# Patient Record
Sex: Male | Born: 1965 | Race: Black or African American | Hispanic: No | Marital: Married | Smoking: Never smoker
Health system: Southern US, Community
[De-identification: ages and names within clinical notes are randomized; demographics above are authoritative.]

## PROBLEM LIST (undated history)

## (undated) HISTORY — PX: TONSILLECTOMY: SUR1361

## (undated) HISTORY — PX: WISDOM TOOTH EXTRACTION: SHX21

## (undated) HISTORY — PX: APPENDECTOMY: SHX54

---

## 2014-09-01 ENCOUNTER — Other Ambulatory Visit (HOSPITAL_COMMUNITY): Payer: Self-pay | Admitting: Family Medicine

## 2014-09-01 DIAGNOSIS — R911 Solitary pulmonary nodule: Secondary | ICD-10-CM

## 2014-09-05 ENCOUNTER — Encounter (HOSPITAL_COMMUNITY): Payer: Self-pay

## 2014-09-05 ENCOUNTER — Ambulatory Visit (HOSPITAL_COMMUNITY)
Admission: RE | Admit: 2014-09-05 | Discharge: 2014-09-05 | Disposition: A | Discharge status: Home / Self Care | Payer: 59 | Source: Ambulatory Visit | Attending: Family Medicine | Admitting: Family Medicine

## 2014-09-05 DIAGNOSIS — R911 Solitary pulmonary nodule: Secondary | ICD-10-CM

## 2014-09-05 DIAGNOSIS — R918 Other nonspecific abnormal finding of lung field: Secondary | ICD-10-CM | POA: Insufficient documentation

## 2016-07-16 IMAGING — CT CT CHEST W/O CM
2 of 3 series · 15 of 36 positions shown, 18 images · non-contrast
Comparison: 01/29/2012

CLINICAL DATA: Known pulmonary nodule, followup examination.

EXAM:
CT CHEST WITHOUT CONTRAST
TECHNIQUE: Multidetector CT imaging of the chest was performed following the
standard protocol without IV contrast..

[Series 2: chestroutine 5.0 b40f · axial · 0.66mm/px · z∈[+786,+1061]mm · 12 of 65 slices shown, 15 images]
[im 5/65  mediastinal]
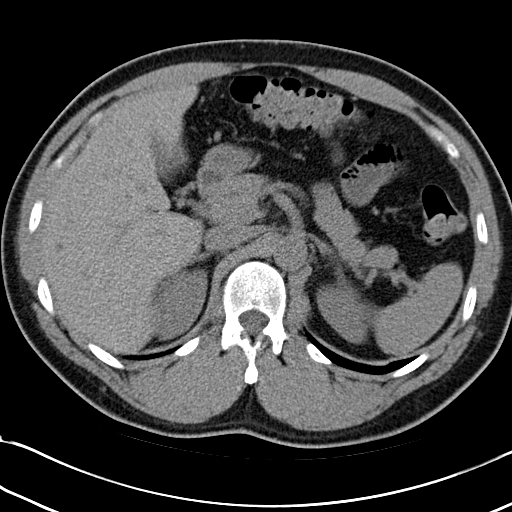
[im 5/65  lung]
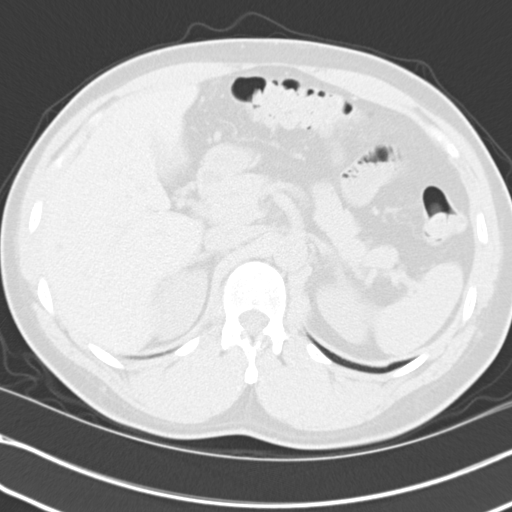
[im 10/65  lung]
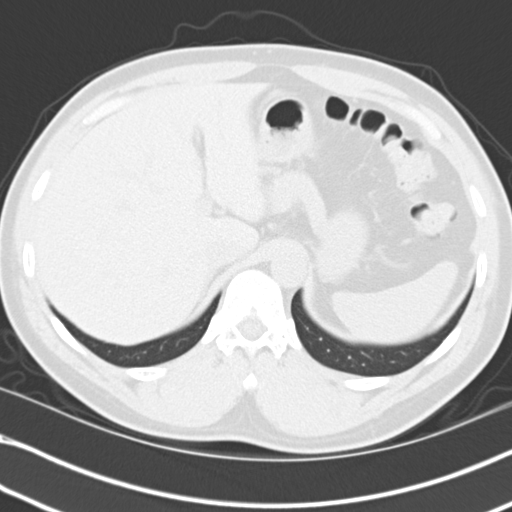
[im 15/65  lung]
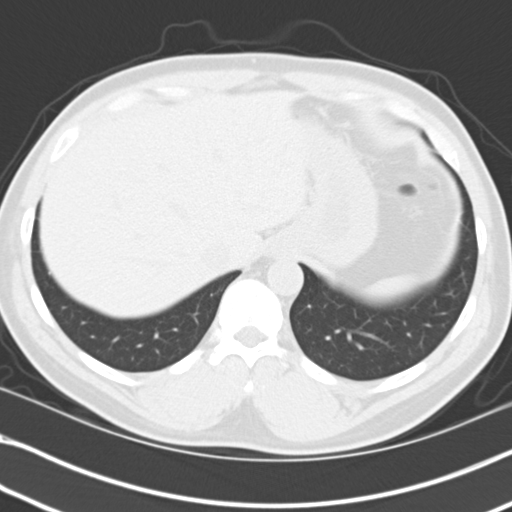
[im 19/65  lung]
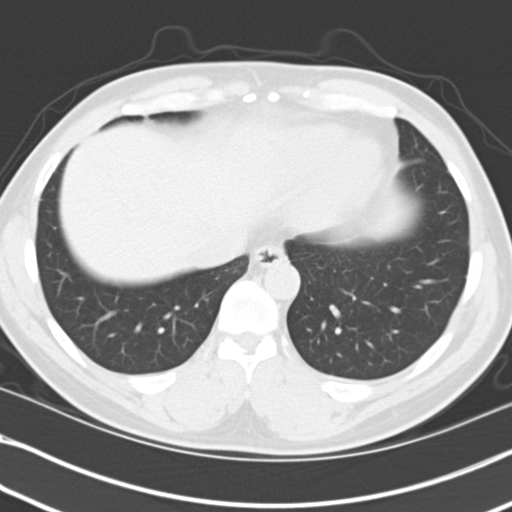
[im 24/65  mediastinal]
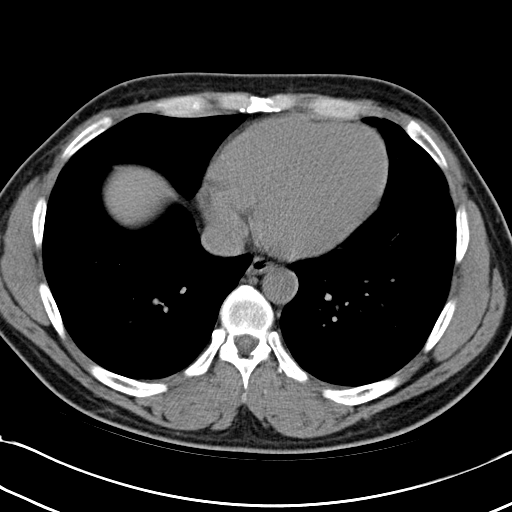
[im 24/65  lung]
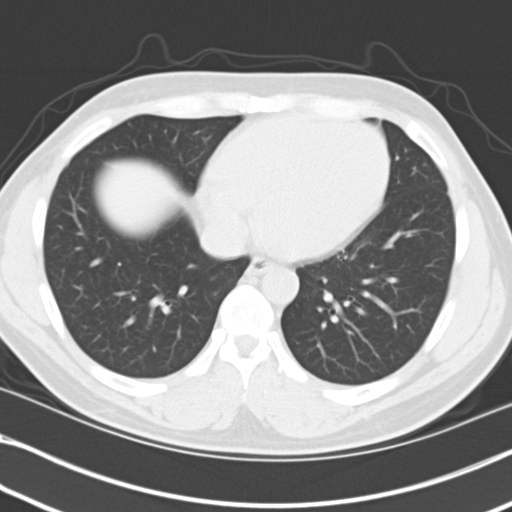
[im 29/65  lung]
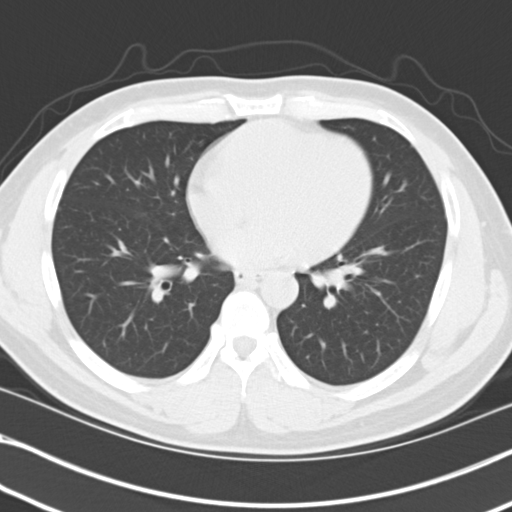
[im 36/65  lung]
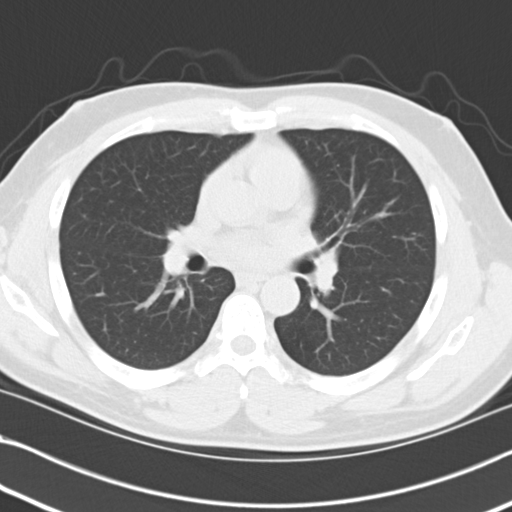
[im 41/65  lung]
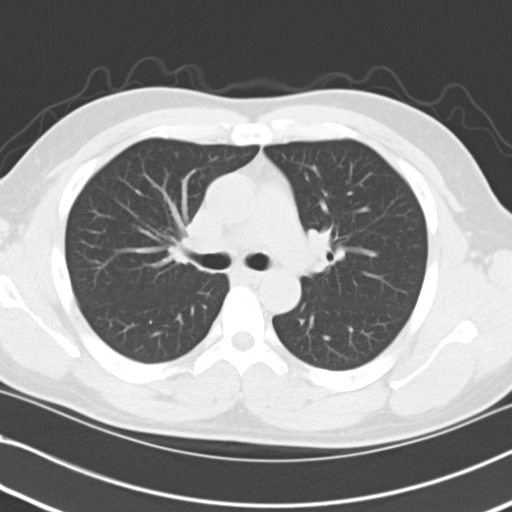
[im 46/65  mediastinal]
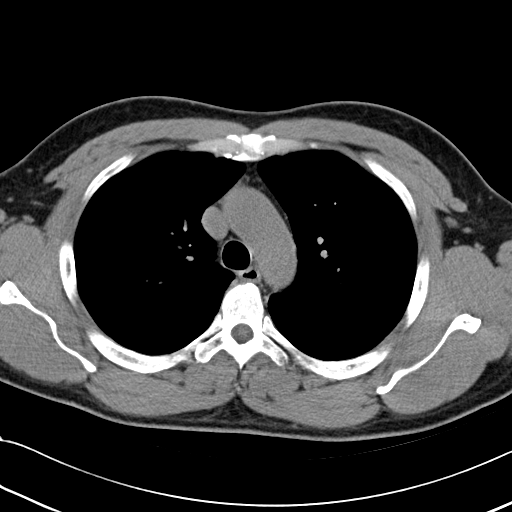
[im 46/65  lung]
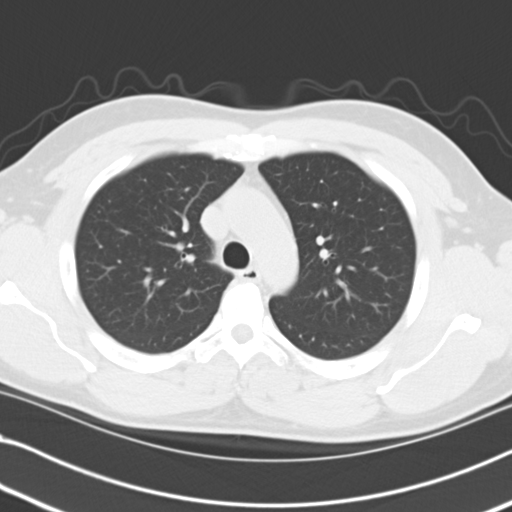
[im 50/65  lung]
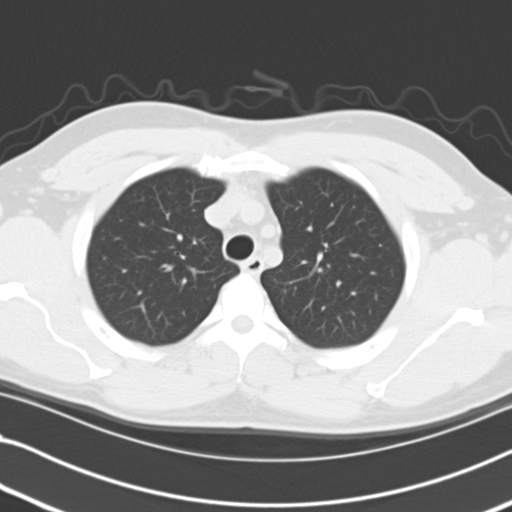
[im 55/65  lung]
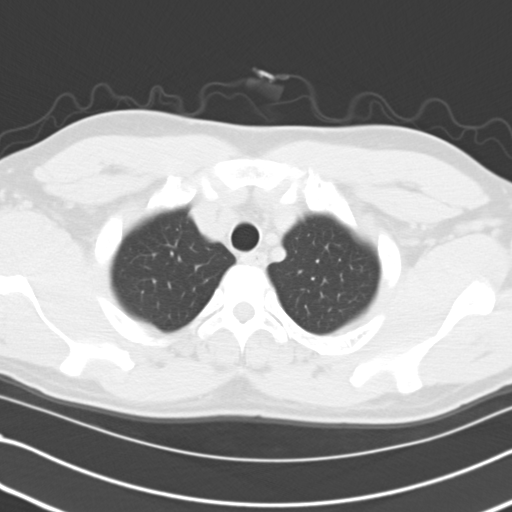
[im 60/65  lung]
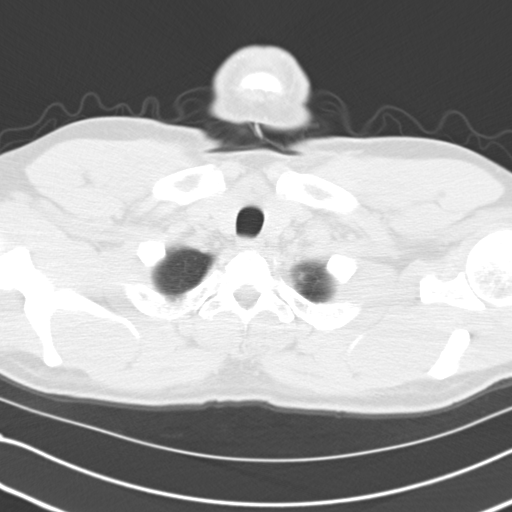

[Series 5: mpr coro 3mm · coronal · 0.64mm/px · 3 of 79 slices shown]
[im 16/79  lung]
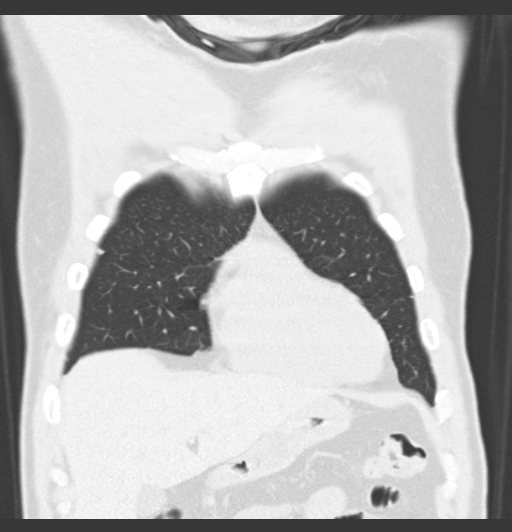
[im 32/79  lung]
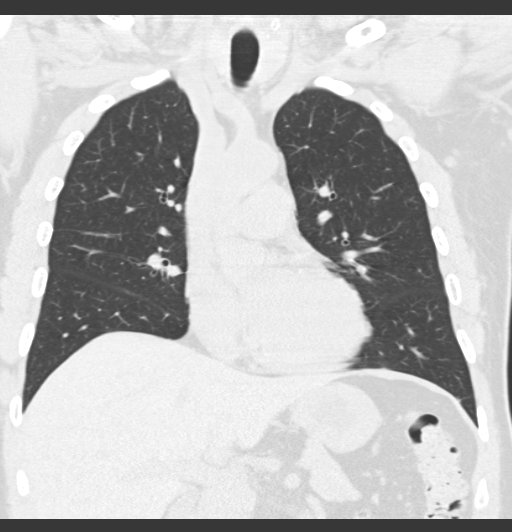
[im 47/79  lung]
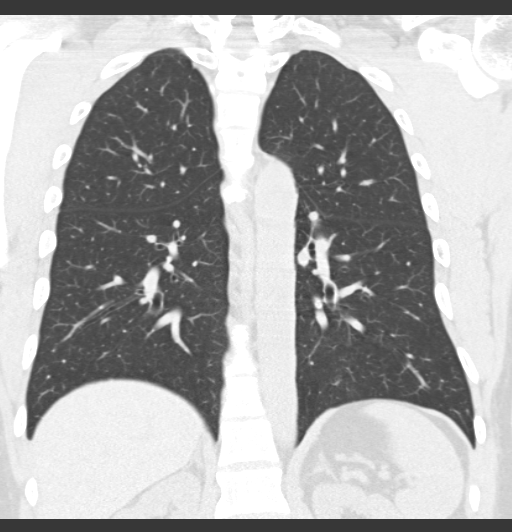

[15 of 36 positions shown; findings below may reference images not displayed]

FINDINGS: The lungs are well aerated bilaterally. No focal infiltrate or
sizable effusion is noted. Previously seen subpleural nodule on the
right in the lower lobe is again identified best seen on image
number 36 of series 3 and image number 31 of series 5. It is stable
in appearance. Given its stability over 2.5 years, no further
followup is necessary. There are however a few other small nodular
densities identified which were not appreciated on the prior exam
due to the imaging technique. These all measure well under 4 mm. No
sizable parenchymal nodule is noted. The thoracic inlet is within
normal limits. No hilar or mediastinal adenopathy is seen. The upper
abdomen shows scattered small cysts throughout the liver. These are
stable in appearance from the prior exam. No other focal upper
abdominal abnormality is seen. The osseous structures are within
normal limits.
IMPRESSION: Stable right lower lobe nodule when compared with the prior exam of
01/29/2012. Given its stability over an extended period of time no
further follow-up is necessary.

Multiple scattered small less than 4 mm nodules predominately within
the left lung. Given the lack of comparison study followup is
recommended as described below. If the patient is at high risk for
bronchogenic carcinoma, follow-up chest CT at 1 year is recommended.
If the patient is at low risk, no follow-up is needed. This
recommendation follows the consensus statement: Guidelines for
Management of Small Pulmonary Nodules Detected on CT Scans: A
Statement from the [HOSPITAL] as published in Radiology
0550; [DATE].

Stable hepatic cysts.

## 2016-11-08 ENCOUNTER — Encounter (HOSPITAL_COMMUNITY): Payer: Self-pay | Admitting: *Deleted

## 2016-11-08 ENCOUNTER — Emergency Department (HOSPITAL_COMMUNITY)
Admission: EM | Admit: 2016-11-08 | Discharge: 2016-11-08 | Disposition: A | Discharge status: Home / Self Care | Payer: 59 | Attending: Emergency Medicine | Admitting: Emergency Medicine

## 2016-11-08 DIAGNOSIS — M79661 Pain in right lower leg: Secondary | ICD-10-CM | POA: Diagnosis not present

## 2016-11-08 DIAGNOSIS — M25521 Pain in right elbow: Secondary | ICD-10-CM | POA: Insufficient documentation

## 2016-11-08 DIAGNOSIS — Y939 Activity, unspecified: Secondary | ICD-10-CM | POA: Insufficient documentation

## 2016-11-08 DIAGNOSIS — Y92410 Unspecified street and highway as the place of occurrence of the external cause: Secondary | ICD-10-CM | POA: Diagnosis not present

## 2016-11-08 DIAGNOSIS — R079 Chest pain, unspecified: Secondary | ICD-10-CM | POA: Diagnosis not present

## 2016-11-08 DIAGNOSIS — Y999 Unspecified external cause status: Secondary | ICD-10-CM | POA: Insufficient documentation

## 2016-11-08 DIAGNOSIS — S8991XA Unspecified injury of right lower leg, initial encounter: Secondary | ICD-10-CM | POA: Diagnosis present

## 2016-11-08 MED ORDER — CYCLOBENZAPRINE HCL 10 MG PO TABS: 10.0000 mg | ORAL_TABLET | Freq: Two times a day (BID) | ORAL | 0 refills | Status: DC | PRN

## 2016-11-08 MED ORDER — IBUPROFEN 600 MG PO TABS: 600.0000 mg | ORAL_TABLET | Freq: Four times a day (QID) | ORAL | 0 refills | Status: DC | PRN

## 2016-11-08 NOTE — ED Notes (Signed)
Acuity increased d/t BP, pt requesting BP to be rechecked, will reassess
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
## 2017-08-06 DIAGNOSIS — H52221 Regular astigmatism, right eye: Secondary | ICD-10-CM | POA: Diagnosis not present

## 2017-10-22 DIAGNOSIS — R7309 Other abnormal glucose: Secondary | ICD-10-CM | POA: Diagnosis not present

## 2017-10-22 DIAGNOSIS — Z1389 Encounter for screening for other disorder: Secondary | ICD-10-CM | POA: Diagnosis not present

## 2017-10-22 DIAGNOSIS — Z0001 Encounter for general adult medical examination with abnormal findings: Secondary | ICD-10-CM | POA: Diagnosis not present

## 2017-10-22 DIAGNOSIS — Z0000 Encounter for general adult medical examination without abnormal findings: Secondary | ICD-10-CM | POA: Diagnosis not present

## 2017-10-22 DIAGNOSIS — R109 Unspecified abdominal pain: Secondary | ICD-10-CM | POA: Diagnosis not present

## 2017-10-22 DIAGNOSIS — R102 Pelvic and perineal pain: Secondary | ICD-10-CM | POA: Diagnosis not present

## 2017-10-29 ENCOUNTER — Other Ambulatory Visit (HOSPITAL_COMMUNITY): Payer: Self-pay | Admitting: Physician Assistant

## 2017-10-29 DIAGNOSIS — R1030 Lower abdominal pain, unspecified: Secondary | ICD-10-CM

## 2017-10-29 DIAGNOSIS — R109 Unspecified abdominal pain: Secondary | ICD-10-CM

## 2017-11-10 ENCOUNTER — Other Ambulatory Visit (HOSPITAL_COMMUNITY): Payer: Self-pay | Admitting: Physician Assistant

## 2017-11-10 DIAGNOSIS — R109 Unspecified abdominal pain: Secondary | ICD-10-CM

## 2017-11-10 DIAGNOSIS — R1030 Lower abdominal pain, unspecified: Secondary | ICD-10-CM

## 2017-11-12 ENCOUNTER — Ambulatory Visit (HOSPITAL_COMMUNITY)
Admission: RE | Admit: 2017-11-12 | Discharge: 2017-11-12 | Disposition: A | Discharge status: Home / Self Care | Payer: 59 | Source: Ambulatory Visit | Attending: Physician Assistant | Admitting: Physician Assistant

## 2017-11-12 ENCOUNTER — Other Ambulatory Visit (HOSPITAL_COMMUNITY): Payer: Self-pay

## 2017-11-12 DIAGNOSIS — R109 Unspecified abdominal pain: Secondary | ICD-10-CM

## 2017-11-12 DIAGNOSIS — R1030 Lower abdominal pain, unspecified: Secondary | ICD-10-CM

## 2017-11-12 DIAGNOSIS — K5730 Diverticulosis of large intestine without perforation or abscess without bleeding: Secondary | ICD-10-CM | POA: Insufficient documentation

## 2017-11-12 DIAGNOSIS — R102 Pelvic and perineal pain: Secondary | ICD-10-CM | POA: Diagnosis not present

## 2017-11-12 DIAGNOSIS — K4090 Unilateral inguinal hernia, without obstruction or gangrene, not specified as recurrent: Secondary | ICD-10-CM | POA: Diagnosis not present

## 2017-11-12 DIAGNOSIS — R918 Other nonspecific abnormal finding of lung field: Secondary | ICD-10-CM | POA: Insufficient documentation

## 2017-11-12 DIAGNOSIS — R1031 Right lower quadrant pain: Secondary | ICD-10-CM | POA: Diagnosis not present

## 2017-11-12 DIAGNOSIS — K5790 Diverticulosis of intestine, part unspecified, without perforation or abscess without bleeding: Secondary | ICD-10-CM | POA: Diagnosis not present

## 2017-11-12 MED ORDER — IOHEXOL 300 MG/ML  SOLN
100.0000 mL | Freq: Once | INTRAMUSCULAR | Status: AC | PRN
  Administered 2017-11-12: 100 mL via INTRAVENOUS

## 2017-12-18 ENCOUNTER — Ambulatory Visit: Payer: 59 | Admitting: General Surgery

## 2017-12-18 ENCOUNTER — Encounter: Payer: Self-pay | Admitting: General Surgery

## 2017-12-18 VITALS — BP 144/96 | HR 90 | Temp 97.7°F | Resp 20 | Wt 212.5 lb

## 2017-12-18 DIAGNOSIS — K4090 Unilateral inguinal hernia, without obstruction or gangrene, not specified as recurrent: Secondary | ICD-10-CM

## 2017-12-18 NOTE — Progress Notes (Signed)
Phillip Carrillo; [id number]; 1966-02-28   HPI Patient is a 52 year old black male who was referred to my care by Dwyane Lu[facility]Ben Mann of [facility] for evaluation and treatment of right inguinal hernia.  Patient states he has had right groin pain intermittently for the past few months.  It seems to be increasing in frequency.  He has the pain when straining every 2 to 3 days.  He does notice a lump.  The pain radiates to the right testicle.  He currently has no abdominal pain.  No nausea or vomiting have been noted. History reviewed. No pertinent past medical history.  Past Surgical History:  Procedure Laterality Date  . APPENDECTOMY    . TONSILLECTOMY    . WISDOM TOOTH EXTRACTION      History reviewed. No pertinent family history.  Current Outpatient Medications on File Prior to Visit  Medication Sig Dispense Refill  . cyclobenzaprine (FLEXERIL) 10 MG tablet Take 1 tablet (10 mg total) by mouth 2 (two) times daily as needed for muscle spasms. 20 tablet 0  . ibuprofen (ADVIL,MOTRIN) 600 MG tablet Take 1 tablet (600 mg total) by mouth every 6 (six) hours as needed. 30 tablet 0   No current facility-administered medications on file prior to visit.     No Known Allergies  Social History   Substance and Sexual Activity  Alcohol Use No    Social History   Tobacco Use  Smoking Status Never Smoker  Smokeless Tobacco Never Used    Review of Systems  Constitutional: Negative.   HENT: Negative.   Eyes: Negative.   Respiratory: Negative.   Cardiovascular: Negative.   Gastrointestinal: Negative.   Genitourinary: Negative.   Musculoskeletal: Negative.   Skin: Negative.   Neurological: Negative.   Endo/Heme/Allergies: Negative.   Psychiatric/Behavioral: Negative.     Objective   Vitals:   12/18/17 0857  BP: (!) 144/96  Pulse: 90  Resp: 20  Temp: 97.7 F (36.5 C)    Physical Exam  Constitutional: He is oriented to person, place, and time. He appears well-developed and  well-nourished. No distress.  HENT:  Head: Normocephalic and atraumatic.  Cardiovascular: Normal rate, regular rhythm and normal heart sounds. Exam reveals no gallop and no friction rub.  No murmur heard. Pulmonary/Chest: Effort normal and breath sounds normal. No stridor. No respiratory distress. He has no wheezes. He has no rales.  Abdominal: Soft. Bowel sounds are normal. He exhibits no distension and no mass. There is no tenderness. There is no guarding. A hernia is present.  Reducible right inguinal hernia.  Genitourinary:  Genitourinary Comments: Genitourinary examination within normal limits.  Neurological: He is alert and oriented to person, place, and time.  Skin: Skin is warm and dry.  Vitals reviewed.  Ben Mann's notes reviewed. Assessment  Right inguinal hernia, reducible Plan   Patient is scheduled for right inguinal herniorrhaphy with mesh on 01/19/2018.  The risks and benefits of the procedure including infection, mesh use, and the possibility of recurrence of the hernia were fully explained to the patient, who gave informed consent.

## 2017-12-18 NOTE — Patient Instructions (Signed)

## 2017-12-31 NOTE — H&P (Signed)
Phillip Carrillo; [id number]; 1965/08/01   HPI Patient is a 52 year old black male who was referred to my care by Dwyane [facility]Ben Mann of [facility] for evaluation and treatment of right inguinal hernia.  Patient states he has had right groin pain intermittently for the past few months.  It seems to be increasing in frequency.  He has the pain when straining every 2 to 3 days.  He does notice a lump.  The pain radiates to the right testicle.  He currently has no abdominal pain.  No nausea or vomiting have been noted. History reviewed. No pertinent past medical history.  Past Surgical History:  Procedure Laterality Date  . APPENDECTOMY    . TONSILLECTOMY    . WISDOM TOOTH EXTRACTION      History reviewed. No pertinent family history.  Current Outpatient Medications on File Prior to Visit  Medication Sig Dispense Refill  . cyclobenzaprine (FLEXERIL) 10 MG tablet Take 1 tablet (10 mg total) by mouth 2 (two) times daily as needed for muscle spasms. 20 tablet 0  . ibuprofen (ADVIL,MOTRIN) 600 MG tablet Take 1 tablet (600 mg total) by mouth every 6 (six) hours as needed. 30 tablet 0   No current facility-administered medications on file prior to visit.     No Known Allergies  Social History   Substance and Sexual Activity  Alcohol Use No    Social History   Tobacco Use  Smoking Status Never Smoker  Smokeless Tobacco Never Used    Review of Systems  Constitutional: Negative.   HENT: Negative.   Eyes: Negative.   Respiratory: Negative.   Cardiovascular: Negative.   Gastrointestinal: Negative.   Genitourinary: Negative.   Musculoskeletal: Negative.   Skin: Negative.   Neurological: Negative.   Endo/Heme/Allergies: Negative.   Psychiatric/Behavioral: Negative.     Objective   Vitals:   12/18/17 0857  BP: (!) 144/96  Pulse: 90  Resp: 20  Temp: 97.7 F (36.5 C)    Physical Exam  Constitutional: He is oriented to person, place, and time. He appears well-developed and  well-nourished. No distress.  HENT:  Head: Normocephalic and atraumatic.  Cardiovascular: Normal rate, regular rhythm and normal heart sounds. Exam reveals no gallop and no friction rub.  No murmur heard. Pulmonary/Chest: Effort normal and breath sounds normal. No stridor. No respiratory distress. He has no wheezes. He has no rales.  Abdominal: Soft. Bowel sounds are normal. He exhibits no distension and no mass. There is no tenderness. There is no guarding. A hernia is present.  Reducible right inguinal hernia.  Genitourinary:  Genitourinary Comments: Genitourinary examination within normal limits.  Neurological: He is alert and oriented to person, place, and time.  Skin: Skin is warm and dry.  Vitals reviewed.  Ben Mann's notes reviewed. Assessment  Right inguinal hernia, reducible Plan   Patient is scheduled for right inguinal herniorrhaphy with mesh on 01/19/2018.  The risks and benefits of the procedure including infection, mesh use, and the possibility of recurrence of the hernia were fully explained to the patient, who gave informed consent.

## 2018-01-12 NOTE — Patient Instructions (Signed)
Phillip Carrillo  01/12/2018     @PREFPERIOPPHARMACY @   Your procedure is scheduled on  01/19/2018 .  Report to [facility] at  615   A.M.  Call this number if you have problems the morning of surgery:  [phone]   Remember:  Do not eat or drink after midnight.  You may drink clear liquids until  12 midnight 01/18/2018  .  Clear liquids allowed are:                    Water, Juice (non-citric and without pulp), Carbonated beverages, Clear Tea, Black Coffee only, Plain Jell-O only, Gatorade and Plain Popsicles only    Take these medicines the morning of surgery with A SIP OF WATER  None    Do not wear jewelry, make-up or nail polish.  Do not wear lotions, powders, or perfumes, or deodorant.  Do not shave 48 hours prior to surgery.  Men may shave face and neck.  Do not bring valuables to the hospital.  [facility] is not responsible for any belongings or valuables.  Contacts, dentures or bridgework may not be worn into surgery.  Leave your suitcase in the car.  After surgery it may be brought to your room.  For patients admitted to the hospital, discharge time will be determined by your treatment team.  Patients discharged the day of surgery will not be allowed to drive home.   Name and phone number of your driver:   family Special instructions:  None  Please read over the following fact sheets that you were given. Anesthesia Post-op Instructions and Care and Recovery After Surgery       Open Hernia Repair, Adult Open hernia repair is a surgical procedure to fix a hernia. A hernia occurs when an internal organ or tissue pushes out through a weak spot in the abdominal wall muscles. Hernias commonly occur in the groin and around the navel. Most hernias tend to get worse over time. Often, surgery is done to prevent the hernia from becoming bigger, uncomfortable, or an emergency. Emergency surgery may be needed if abdominal contents get stuck in the opening  (incarcerated hernia) or the blood supply gets cut off (strangulated hernia). In an open repair, an incision is made in the abdomen to perform the surgery. Tell a health care provider about:  Any allergies you have.  All medicines you are taking, including vitamins, herbs, eye drops, creams, and over-the-counter medicines.  Any problems you or family members have had with anesthetic medicines.  Any blood or bone disorders you have.  Any surgeries you have had.  Any medical conditions you have, including any recent cold or flu symptoms.  Whether you are pregnant or may be pregnant. What are the risks? Generally, this is a safe procedure. However, problems may occur, including:  Long-lasting (chronic) pain.  Bleeding.  Infection.  Damage to the testicle. This can cause shrinking or swelling.  Damage to the bladder, blood vessels, intestine, or nerves near the hernia.  Trouble passing urine.  Allergic reactions to medicines.  Return of the hernia.  What happens before the procedure? Staying hydrated Follow instructions from your health care provider about hydration, which may include:  Up to 2 hours before the procedure - you may continue to drink clear liquids, such as water, clear fruit juice, black coffee, and plain tea.  Eating and drinking restrictions Follow instructions from your health care provider  about eating and drinking, which may include:  8 hours before the procedure - stop eating heavy meals or foods such as meat, fried foods, or fatty foods.  6 hours before the procedure - stop eating light meals or foods, such as toast or cereal.  6 hours before the procedure - stop drinking milk or drinks that contain milk.  2 hours before the procedure - stop drinking clear liquids.  Medicines  Ask your health care provider about: ? Changing or stopping your regular medicines. This is especially important if you are taking diabetes medicines or blood  thinners. ? Taking medicines such as aspirin and ibuprofen. These medicines can thin your blood. Do not take these medicines before your procedure if your health care provider instructs you not to.  You may be given antibiotic medicine to help prevent infection. General instructions  You may have blood tests or imaging studies.  Ask your health care provider how your surgical site will be marked or identified.  If you smoke, do not smoke for at least 2 weeks before your procedure or for as long as told by your health care provider.  Let your health care provider know if you develop a cold or any infection before your surgery.  Plan to have someone take you home from the hospital or clinic.  If you will be going home right after the procedure, plan to have someone with you for 24 hours. What happens during the procedure?  To reduce your risk of infection: ? Your health care team will wash or sanitize their hands. ? Your skin will be washed with soap. ? Hair may be removed from the surgical area.  An IV tube will be inserted into one of your veins.  You will be given one or more of the following: ? A medicine to help you relax (sedative). ? A medicine to numb the area (local anesthetic). ? A medicine to make you fall asleep (general anesthetic).  Your surgeon will make an incision over the hernia.  The tissues of the hernia will be moved back into place.  The edges of the hernia may be stitched together.  The opening in the abdominal muscles will be closed with stitches (sutures). Or, your surgeon will place a mesh patch made of manmade (synthetic) material over the opening.  The incision will be closed.  A bandage (dressing) may be placed over the incision. The procedure may vary among health care providers and hospitals. What happens after the procedure?  Your blood pressure, heart rate, breathing rate, and blood oxygen level will be monitored until the medicines you were  given have worn off.  You may be given medicine for pain.  Do not drive for 24 hours if you received a sedative. This information is not intended to replace advice given to you by your health care provider. Make sure you discuss any questions you have with your health care provider. Document Released: 11/13/2000 Document Revised: 12/08/2015 Document Reviewed: 11/01/2015 Elsevier Interactive Patient Education  2018 [employer]  Open Hernia Repair, Adult, Care After These instructions give you information about caring for yourself after your procedure. Your doctor may also give you more specific instructions. If you have problems or questions, contact your doctor. Follow these instructions at home: Surgical cut (incision) care   Follow instructions from your doctor about how to take care of your surgical cut area. Make sure you: ? Wash your hands with soap and water before you change your bandage (dressing).  If you cannot use soap and water, use hand sanitizer. ? Change your bandage as told by your doctor. ? Leave stitches (sutures), skin glue, or skin tape (adhesive) strips in place. They may need to stay in place for 2 weeks or longer. If tape strips get loose and curl up, you may trim the loose edges. Do not remove tape strips completely unless your doctor says it is okay.  Check your surgical cut every day for signs of infection. Check for: ? More redness, swelling, or pain. ? More fluid or blood. ? Warmth. ? Pus or a bad smell. Activity  Do not drive or use heavy machinery while taking prescription pain medicine. Do not drive until your doctor says it is okay.  Until your doctor says it is okay: ? Do not lift anything that is heavier than 10 lb (4.5 kg). ? Do not play contact sports.  Return to your normal activities as told by your doctor. Ask your doctor what activities are safe. General instructions  To prevent or treat having a hard time pooping (constipation) while you  are taking prescription pain medicine, your doctor may recommend that you: ? Drink enough fluid to keep your pee (urine) clear or pale yellow. ? Take over-the-counter or prescription medicines. ? Eat foods that are high in fiber, such as fresh fruits and vegetables, whole grains, and beans. ? Limit foods that are high in fat and processed sugars, such as fried and sweet foods.  Take over-the-counter and prescription medicines only as told by your doctor.  Do not take baths, swim, or use a hot tub until your doctor says it is okay.  Keep all follow-up visits as told by your doctor. This is important. Contact a doctor if:  You develop a rash.  You have more redness, swelling, or pain around your surgical cut.  You have more fluid or blood coming from your surgical cut.  Your surgical cut feels warm to the touch.  You have pus or a bad smell coming from your surgical cut.  You have a fever or chills.  You have blood in your poop (stool).  You have not pooped in 2-3 days.  Medicine does not help your pain. Get help right away if:  You have chest pain or you are short of breath.  You feel light-headed.  You feel weak and dizzy (feel faint).  You have very bad pain.  You throw up (vomit) and your pain is worse. This information is not intended to replace advice given to you by your health care provider. Make sure you discuss any questions you have with your health care provider. Document Released: 06/10/2014 Document Revised: 12/08/2015 Document Reviewed: 11/01/2015 Elsevier Interactive Patient Education  2018 [employer].  General Anesthesia, Adult General anesthesia is the use of medicines to make a person "go to sleep" (be unconscious) for a medical procedure. General anesthesia is often recommended when a procedure:  Is long.  Requires you to be still or in an unusual position.  Is major and can cause you to lose blood.  Is impossible to do without general  anesthesia.  The medicines used for general anesthesia are called general anesthetics. In addition to making you sleep, the medicines:  Prevent pain.  Control your blood pressure.  Relax your muscles.  Tell a health care provider about:  Any allergies you have.  All medicines you are taking, including vitamins, herbs, eye drops, creams, and over-the-counter medicines.  Any problems you or family  members have had with anesthetic medicines.  Types of anesthetics you have had in the past.  Any bleeding disorders you have.  Any surgeries you have had.  Any medical conditions you have.  Any history of heart or lung conditions, such as heart failure, sleep apnea, or chronic obstructive pulmonary disease (COPD).  Whether you are pregnant or may be pregnant.  Whether you use tobacco, alcohol, marijuana, or street drugs.  Any history of Financial plannermilitary service.  Any history of depression or anxiety. What are the risks? Generally, this is a safe procedure. However, problems may occur, including:  Allergic reaction to anesthetics.  Lung and heart problems.  Inhaling food or liquids from your stomach into your lungs (aspiration).  Injury to nerves.  Waking up during your procedure and being unable to move (rare).  Extreme agitation or a state of mental confusion (delirium) when you wake up from the anesthetic.  Air in the bloodstream, which can lead to stroke.  These problems are more likely to develop if you are having a major surgery or if you have an advanced medical condition. You can prevent some of these complications by answering all of your health care provider's questions thoroughly and by following all pre-procedure instructions. General anesthesia can cause side effects, including:  Nausea or vomiting  A sore throat from the breathing tube.  Feeling cold or shivery.  Feeling tired, washed out, or achy.  Sleepiness or drowsiness.  Confusion or  agitation.  What happens before the procedure? Staying hydrated Follow instructions from your health care provider about hydration, which may include:  Up to 2 hours before the procedure - you may continue to drink clear liquids, such as water, clear fruit juice, black coffee, and plain tea.  Eating and drinking restrictions Follow instructions from your health care provider about eating and drinking, which may include:  8 hours before the procedure - stop eating heavy meals or foods such as meat, fried foods, or fatty foods.  6 hours before the procedure - stop eating light meals or foods, such as toast or cereal.  6 hours before the procedure - stop drinking milk or drinks that contain milk.  2 hours before the procedure - stop drinking clear liquids.  Medicines  Ask your health care provider about: ? Changing or stopping your regular medicines. This is especially important if you are taking diabetes medicines or blood thinners. ? Taking medicines such as aspirin and ibuprofen. These medicines can thin your blood. Do not take these medicines before your procedure if your health care provider instructs you not to. ? Taking new dietary supplements or medicines. Do not take these during the week before your procedure unless your health care provider approves them.  If you are told to take a medicine or to continue taking a medicine on the day of the procedure, take the medicine with sips of water. General instructions   Ask if you will be going home the same day, the following day, or after a longer hospital stay. ? Plan to have someone take you home. ? Plan to have someone stay with you for the first 24 hours after you leave the hospital or clinic.  For 3-6 weeks before the procedure, try not to use any tobacco products, such as cigarettes, chewing tobacco, and e-cigarettes.  You may brush your teeth on the morning of the procedure, but make sure to spit out the toothpaste. What  happens during the procedure?  You will be given anesthetics through  a mask and through an IV tube in one of your veins.  You may receive medicine to help you relax (sedative).  As soon as you are asleep, a breathing tube may be used to help you breathe.  An anesthesia specialist will stay with you throughout the procedure. He or she will help keep you comfortable and safe by continuing to give you medicines and adjusting the amount of medicine that you get. He or she will also watch your blood pressure, pulse, and oxygen levels to make sure that the anesthetics do not cause any problems.  If a breathing tube was used to help you breathe, it will be removed before you wake up. The procedure may vary among health care providers and hospitals. What happens after the procedure?  You will wake up, often slowly, after the procedure is complete, usually in a recovery area.  Your blood pressure, heart rate, breathing rate, and blood oxygen level will be monitored until the medicines you were given have worn off.  You may be given medicine to help you calm down if you feel anxious or agitated.  If you will be going home the same day, your health care provider may check to make sure you can stand, drink, and urinate.  Your health care providers will treat your pain and side effects before you go home.  Do not drive for 24 hours if you received a sedative.  You may: ? Feel nauseous and vomit. ? Have a sore throat. ? Have mental slowness. ? Feel cold or shivery. ? Feel sleepy. ? Feel tired. ? Feel sore or achy, even in parts of your body where you did not have surgery. This information is not intended to replace advice given to you by your health care provider. Make sure you discuss any questions you have with your health care provider. Document Released: 08/27/2007 Document Revised: 10/31/2015 Document Reviewed: 05/04/2015 Elsevier Interactive Patient Education  2018 [employer]. General  Anesthesia, Adult, Care After These instructions provide you with information about caring for yourself after your procedure. Your health care provider may also give you more specific instructions. Your treatment has been planned according to current medical practices, but problems sometimes occur. Call your health care provider if you have any problems or questions after your procedure. What can I expect after the procedure? After the procedure, it is common to have:  Vomiting.  A sore throat.  Mental slowness.  It is common to feel:  Nauseous.  Cold or shivery.  Sleepy.  Tired.  Sore or achy, even in parts of your body where you did not have surgery.  Follow these instructions at home: For at least 24 hours after the procedure:  Do not: ? Participate in activities where you could fall or become injured. ? Drive. ? Use heavy machinery. ? Drink alcohol. ? Take sleeping pills or medicines that cause drowsiness. ? Make important decisions or sign legal documents. ? Take care of children on your own.  Rest. Eating and drinking  If you vomit, drink water, juice, or soup when you can drink without vomiting.  Drink enough fluid to keep your urine clear or pale yellow.  Make sure you have little or no nausea before eating solid foods.  Follow the diet recommended by your health care provider. General instructions  Have a responsible adult stay with you until you are awake and alert.  Return to your normal activities as told by your health care provider. Ask your health care  provider what activities are safe for you.  Take over-the-counter and prescription medicines only as told by your health care provider.  If you smoke, do not smoke without supervision.  Keep all follow-up visits as told by your health care provider. This is important. Contact a health care provider if:  You continue to have nausea or vomiting at home, and medicines are not helpful.  You cannot  drink fluids or start eating again.  You cannot urinate after 8-12 hours.  You develop a skin rash.  You have fever.  You have increasing redness at the site of your procedure. Get help right away if:  You have difficulty breathing.  You have chest pain.  You have unexpected bleeding.  You feel that you are having a life-threatening or urgent problem. This information is not intended to replace advice given to you by your health care provider. Make sure you discuss any questions you have with your health care provider. Document Released: 08/26/2000 Document Revised: 10/23/2015 Document Reviewed: 05/04/2015 Elsevier Interactive Patient Education  [employer].

## 2018-01-14 ENCOUNTER — Encounter (HOSPITAL_COMMUNITY): Payer: Self-pay

## 2018-01-14 ENCOUNTER — Other Ambulatory Visit: Payer: Self-pay

## 2018-01-14 ENCOUNTER — Encounter (HOSPITAL_COMMUNITY)
Admission: RE | Admit: 2018-01-14 | Discharge: 2018-01-14 | Disposition: A | Discharge status: Home / Self Care | Payer: 59 | Source: Ambulatory Visit | Attending: General Surgery | Admitting: General Surgery

## 2018-01-14 DIAGNOSIS — Z01818 Encounter for other preprocedural examination: Secondary | ICD-10-CM | POA: Diagnosis not present

## 2018-01-14 DIAGNOSIS — Z01810 Encounter for preprocedural cardiovascular examination: Secondary | ICD-10-CM | POA: Insufficient documentation

## 2018-01-14 DIAGNOSIS — R9431 Abnormal electrocardiogram [ECG] [EKG]: Secondary | ICD-10-CM | POA: Diagnosis not present

## 2018-01-14 DIAGNOSIS — K4090 Unilateral inguinal hernia, without obstruction or gangrene, not specified as recurrent: Secondary | ICD-10-CM | POA: Insufficient documentation

## 2018-01-14 LAB — CBC WITH DIFFERENTIAL/PLATELET
Basophils Absolute: 0 10*3/uL (ref 0.0–0.1)
Basophils Relative: 0 %
Eosinophils Absolute: 0 10*3/uL (ref 0.0–0.7)
Eosinophils Relative: 0 %
HEMATOCRIT: 48.7 % (ref 39.0–52.0)
HEMOGLOBIN: 16.6 g/dL (ref 13.0–17.0)
LYMPHS ABS: 1.9 10*3/uL (ref 0.7–4.0)
LYMPHS PCT: 18 %
MCH: 29.1 pg (ref 26.0–34.0)
MCHC: 34.1 g/dL (ref 30.0–36.0)
MCV: 85.3 fL (ref 78.0–100.0)
MONO ABS: 0.5 10*3/uL (ref 0.1–1.0)
MONOS PCT: 5 %
NEUTROS ABS: 7.8 10*3/uL — AB (ref 1.7–7.7)
Neutrophils Relative %: 77 %
Platelets: 232 10*3/uL (ref 150–400)
RBC: 5.71 MIL/uL (ref 4.22–5.81)
RDW: 13.1 % (ref 11.5–15.5)
WBC: 10.2 10*3/uL (ref 4.0–10.5)

## 2018-01-14 LAB — BASIC METABOLIC PANEL
ANION GAP: 13 (ref 5–15)
BUN: 16 mg/dL (ref 6–20)
CALCIUM: 9.1 mg/dL (ref 8.9–10.3)
CHLORIDE: 102 mmol/L (ref 98–111)
CO2: 22 mmol/L (ref 22–32)
Creatinine, Ser: 1.29 mg/dL — ABNORMAL HIGH (ref 0.61–1.24)
GFR calc Af Amer: >60 mL/min (ref >60)
GFR calc non Af Amer: >60 mL/min (ref >60)
GLUCOSE: 60 mg/dL — AB (ref 70–99)
POTASSIUM: 3.9 mmol/L (ref 3.5–5.1)
Sodium: 137 mmol/L (ref 135–145)

## 2018-01-19 ENCOUNTER — Encounter (HOSPITAL_COMMUNITY)
Admission: RE | Disposition: A | Discharge status: Home / Self Care | Payer: Self-pay | Source: Ambulatory Visit | Attending: General Surgery

## 2018-01-19 ENCOUNTER — Ambulatory Visit (HOSPITAL_COMMUNITY): Payer: 59 | Admitting: Anesthesiology

## 2018-01-19 ENCOUNTER — Ambulatory Visit (HOSPITAL_COMMUNITY)
Admission: RE | Admit: 2018-01-19 | Discharge: 2018-01-19 | Disposition: A | Discharge status: Home / Self Care | Payer: 59 | Source: Ambulatory Visit | Attending: General Surgery | Admitting: General Surgery

## 2018-01-19 ENCOUNTER — Encounter (HOSPITAL_COMMUNITY): Payer: Self-pay

## 2018-01-19 DIAGNOSIS — R030 Elevated blood-pressure reading, without diagnosis of hypertension: Secondary | ICD-10-CM | POA: Diagnosis not present

## 2018-01-19 DIAGNOSIS — K4090 Unilateral inguinal hernia, without obstruction or gangrene, not specified as recurrent: Secondary | ICD-10-CM

## 2018-01-19 HISTORY — PX: INGUINAL HERNIA REPAIR: SHX194

## 2018-01-19 SURGERY — REPAIR, HERNIA, INGUINAL, ADULT
Anesthesia: General | Laterality: Right

## 2018-01-19 MED ORDER — HYDROMORPHONE HCL 1 MG/ML IJ SOLN
0.2500 mg | INTRAMUSCULAR | Status: DC | PRN
  Administered 2018-01-19 (×2): 0.5 mg via INTRAVENOUS
  Filled 2018-01-19 (×2): qty 0.5

## 2018-01-19 MED ORDER — GLYCOPYRROLATE 0.2 MG/ML IJ SOLN
INTRAMUSCULAR | Status: DC | PRN
  Administered 2018-01-19: 0.1 mg via INTRAVENOUS

## 2018-01-19 MED ORDER — CEFAZOLIN SODIUM-DEXTROSE 2-4 GM/100ML-% IV SOLN
INTRAVENOUS | Status: AC
  Filled 2018-01-19: qty 100

## 2018-01-19 MED ORDER — PROPOFOL 10 MG/ML IV BOLUS
INTRAVENOUS | Status: DC | PRN
  Administered 2018-01-19: 200 mg via INTRAVENOUS

## 2018-01-19 MED ORDER — DEXAMETHASONE SODIUM PHOSPHATE 4 MG/ML IJ SOLN
INTRAMUSCULAR | Status: DC | PRN
  Administered 2018-01-19: 4 mg via INTRAVENOUS

## 2018-01-19 MED ORDER — PROMETHAZINE HCL 25 MG/ML IJ SOLN: 6.2500 mg | INTRAMUSCULAR | Status: DC | PRN

## 2018-01-19 MED ORDER — DEXAMETHASONE SODIUM PHOSPHATE 4 MG/ML IJ SOLN
INTRAMUSCULAR | Status: AC
  Filled 2018-01-19: qty 1

## 2018-01-19 MED ORDER — ONDANSETRON HCL 4 MG/2ML IJ SOLN
INTRAMUSCULAR | Status: AC
  Filled 2018-01-19: qty 2

## 2018-01-19 MED ORDER — SODIUM CHLORIDE 0.9 % IR SOLN
Status: DC | PRN
  Administered 2018-01-19: 1000 mL

## 2018-01-19 MED ORDER — LIDOCAINE HCL (PF) 1 % IJ SOLN
INTRAMUSCULAR | Status: AC
  Filled 2018-01-19: qty 5

## 2018-01-19 MED ORDER — MIDAZOLAM HCL 2 MG/2ML IJ SOLN
INTRAMUSCULAR | Status: AC
  Filled 2018-01-19: qty 2

## 2018-01-19 MED ORDER — KETOROLAC TROMETHAMINE 30 MG/ML IJ SOLN
30.0000 mg | Freq: Once | INTRAMUSCULAR | Status: AC
Start: 2018-01-19 — End: 2018-01-19
  Administered 2018-01-19: 30 mg via INTRAVENOUS

## 2018-01-19 MED ORDER — BUPIVACAINE LIPOSOME 1.3 % IJ SUSP
INTRAMUSCULAR | Status: AC
  Filled 2018-01-19: qty 20

## 2018-01-19 MED ORDER — OXYCODONE-ACETAMINOPHEN 7.5-325 MG PO TABS: 1.0000 | ORAL_TABLET | Freq: Four times a day (QID) | ORAL | 0 refills | Status: AC | PRN

## 2018-01-19 MED ORDER — ONDANSETRON HCL 4 MG/2ML IJ SOLN
INTRAMUSCULAR | Status: DC | PRN
  Administered 2018-01-19: 4 mg via INTRAVENOUS

## 2018-01-19 MED ORDER — BUPIVACAINE LIPOSOME 1.3 % IJ SUSP
INTRAMUSCULAR | Status: DC | PRN
  Administered 2018-01-19: 20 mL

## 2018-01-19 MED ORDER — MEPERIDINE HCL 50 MG/ML IJ SOLN: 6.2500 mg | INTRAMUSCULAR | Status: DC | PRN

## 2018-01-19 MED ORDER — CEFAZOLIN SODIUM-DEXTROSE 2-4 GM/100ML-% IV SOLN
2.0000 g | INTRAVENOUS | Status: AC
  Administered 2018-01-19: 2 g via INTRAVENOUS

## 2018-01-19 MED ORDER — CHLORHEXIDINE GLUCONATE CLOTH 2 % EX PADS: 6.0000 | MEDICATED_PAD | Freq: Once | CUTANEOUS | Status: DC

## 2018-01-19 MED ORDER — KETOROLAC TROMETHAMINE 30 MG/ML IJ SOLN
INTRAMUSCULAR | Status: AC
  Filled 2018-01-19: qty 1

## 2018-01-19 MED ORDER — LACTATED RINGERS IV SOLN: INTRAVENOUS | Status: DC

## 2018-01-19 MED ORDER — PROPOFOL 10 MG/ML IV BOLUS
INTRAVENOUS | Status: AC
  Filled 2018-01-19: qty 40

## 2018-01-19 MED ORDER — FENTANYL CITRATE (PF) 100 MCG/2ML IJ SOLN
INTRAMUSCULAR | Status: DC | PRN
  Administered 2018-01-19: 25 ug via INTRAVENOUS
  Administered 2018-01-19 (×2): 50 ug via INTRAVENOUS

## 2018-01-19 MED ORDER — HYDROCODONE-ACETAMINOPHEN 7.5-325 MG PO TABS: 1.0000 | ORAL_TABLET | Freq: Once | ORAL | Status: DC | PRN

## 2018-01-19 MED ORDER — LACTATED RINGERS IV SOLN
INTRAVENOUS | Status: DC
  Administered 2018-01-19: 07:00:00 via INTRAVENOUS

## 2018-01-19 MED ORDER — FENTANYL CITRATE (PF) 250 MCG/5ML IJ SOLN
INTRAMUSCULAR | Status: AC
  Filled 2018-01-19: qty 5

## 2018-01-19 MED ORDER — MIDAZOLAM HCL 2 MG/2ML IJ SOLN
INTRAMUSCULAR | Status: DC | PRN
  Administered 2018-01-19: 2 mg via INTRAVENOUS

## 2018-01-19 MED ORDER — LIDOCAINE HCL (CARDIAC) PF 100 MG/5ML IV SOSY
PREFILLED_SYRINGE | INTRAVENOUS | Status: DC | PRN
  Administered 2018-01-19: 50 mg via INTRAVENOUS

## 2018-01-19 SURGICAL SUPPLY — 37 items
CLOTH BEACON ORANGE TIMEOUT ST (SAFETY) ×3 IMPLANT
COVER LIGHT HANDLE STERIS (MISCELLANEOUS) ×6 IMPLANT
DERMABOND ADVANCED (GAUZE/BANDAGES/DRESSINGS) ×2
DERMABOND ADVANCED .7 DNX12 (GAUZE/BANDAGES/DRESSINGS) ×1 IMPLANT
DRAIN PENROSE 18X1/2 LTX STRL (DRAIN) ×3 IMPLANT
DURAPREP 26ML APPLICATOR (WOUND CARE) ×3 IMPLANT
ELECT REM PT RETURN 9FT ADLT (ELECTROSURGICAL) ×3
ELECTRODE REM PT RTRN 9FT ADLT (ELECTROSURGICAL) ×1 IMPLANT
GAUZE SPONGE 4X4 12PLY STRL (GAUZE/BANDAGES/DRESSINGS) ×3 IMPLANT
GLOVE BIO SURGEON STRL SZ 6.5 (GLOVE) ×2 IMPLANT
GLOVE BIO SURGEONS STRL SZ 6.5 (GLOVE) ×1
GLOVE BIOGEL PI IND STRL 6.5 (GLOVE) ×1 IMPLANT
GLOVE BIOGEL PI IND STRL 7.0 (GLOVE) ×2 IMPLANT
GLOVE BIOGEL PI INDICATOR 6.5 (GLOVE) ×2
GLOVE BIOGEL PI INDICATOR 7.0 (GLOVE) ×4
GLOVE SURG SS PI 7.5 STRL IVOR (GLOVE) ×3 IMPLANT
GOWN STRL REUS W/ TWL XL LVL3 (GOWN DISPOSABLE) ×1 IMPLANT
GOWN STRL REUS W/TWL LRG LVL3 (GOWN DISPOSABLE) ×6 IMPLANT
GOWN STRL REUS W/TWL XL LVL3 (GOWN DISPOSABLE) ×2
INST SET MINOR GENERAL (KITS) ×3 IMPLANT
KIT TURNOVER KIT A (KITS) ×3 IMPLANT
MANIFOLD NEPTUNE II (INSTRUMENTS) ×3 IMPLANT
MESH MARLEX PLUG MEDIUM (Mesh General) ×3 IMPLANT
NEEDLE HYPO 21X1.5 SAFETY (NEEDLE) ×3 IMPLANT
NS IRRIG 1000ML POUR BTL (IV SOLUTION) ×3 IMPLANT
PACK MINOR (CUSTOM PROCEDURE TRAY) ×3 IMPLANT
PAD ARMBOARD 7.5X6 YLW CONV (MISCELLANEOUS) ×3 IMPLANT
SET BASIN LINEN APH (SET/KITS/TRAYS/PACK) ×3 IMPLANT
SOL PREP PROV IODINE SCRUB 4OZ (MISCELLANEOUS) ×3 IMPLANT
SUT MNCRL AB 4-0 PS2 18 (SUTURE) ×3 IMPLANT
SUT NOVA NAB GS-22 2 2-0 T-19 (SUTURE) ×3 IMPLANT
SUT PROLENE 2 0 SH 30 (SUTURE) ×6 IMPLANT
SUT VIC AB 2-0 CT1 27 (SUTURE) ×2
SUT VIC AB 2-0 CT1 TAPERPNT 27 (SUTURE) ×1 IMPLANT
SUT VIC AB 3-0 SH 27 (SUTURE) ×2
SUT VIC AB 3-0 SH 27X BRD (SUTURE) ×1 IMPLANT
SYR 20CC LL (SYRINGE) ×3 IMPLANT

## 2018-01-19 NOTE — Anesthesia Procedure Notes (Signed)
Procedure Name: LMA Insertion Date/Time: 01/19/2018 7:34 AM Performed by: Earmon PhoenixWilkerson, Ashtian Villacis P, CRNA Pre-anesthesia Checklist: Patient identified, Emergency Drugs available, Suction available, Patient being monitored and Timeout performed Patient Re-evaluated:Patient Re-evaluated prior to induction Oxygen Delivery Method: Circle system utilized Preoxygenation: Pre-oxygenation with 100% oxygen Induction Type: IV induction Ventilation: Mask ventilation without difficulty LMA: LMA inserted LMA Size: 5.0 Number of attempts: 1 Placement Confirmation: positive ETCO2,  CO2 detector and breath sounds checked- equal and bilateral Tube secured with: Tape Dental Injury: Teeth and Oropharynx as per pre-operative assessment

## 2018-01-19 NOTE — Transfer of Care (Signed)
Immediate Anesthesia Transfer of Care Note  Patient: Phillip Carrillo  Procedure(s) Performed: RIGHT INGUINAL HERNIORRHAPHY WITH MESH (Right )  Patient Location: PACU  Anesthesia Type:General  Level of Consciousness: drowsy and patient cooperative  Airway & Oxygen Therapy: Patient Spontanous Breathing and Patient connected to face mask oxygen  Post-op Assessment: Report given to RN and Post -op Vital signs reviewed and stable  Post vital signs: Reviewed and stable  Last Vitals:  Vitals Value Taken Time  BP    Temp    Pulse    Resp    SpO2      Last Pain:  Vitals:   01/19/18 0641  TempSrc: Oral  PainSc: 0-No pain         Complications: No apparent anesthesia complications

## 2018-01-19 NOTE — Progress Notes (Signed)
Spoke with patient at length concerning elevated BP. He states he will go see PCP. We discussed diet and exercise as well. He verbalized understanding.

## 2018-01-19 NOTE — Interval H&P Note (Signed)
History and Physical Interval Note:  01/19/2018 7:10 AM  Phillip Carrillo R Hanauer  has presented today for surgery, with the diagnosis of right inguinal hernia  The various methods of treatment have been discussed with the patient and family. After consideration of risks, benefits and other options for treatment, the patient has consented to  Procedure(s): HERNIA REPAIR INGUINAL ADULT WITH MESH (Right) as a surgical intervention .  The patient's history has been reviewed, patient examined, no change in status, stable for surgery.  I have reviewed the patient's chart and labs.  Questions were answered to the patient's satisfaction.     Franky MachoMark Shantara Goosby

## 2018-01-19 NOTE — Discharge Instructions (Signed)
Open Hernia Repair, Adult, Care After °This sheet gives you information about how to care for yourself after your procedure. Your health care provider may also give you more specific instructions. If you have problems or questions, contact your health care provider. °What can I expect after the procedure? °After the procedure, it is common to have: °· Mild discomfort. °· Slight bruising. °· Minor swelling. °· Pain in the abdomen. ° °Follow these instructions at home: °Incision care ° °· Follow instructions from your health care provider about how to take care of your incision area. Make sure you: °? Wash your hands with soap and water before you change your bandage (dressing). If soap and water are not available, use hand sanitizer. °? Change your dressing as told by your health care provider. °? Leave stitches (sutures), skin glue, or adhesive strips in place. These skin closures may need to stay in place for 2 weeks or longer. If adhesive strip edges start to loosen and curl up, you may trim the loose edges. Do not remove adhesive strips completely unless your health care provider tells you to do that. °· Check your incision area every day for signs of infection. Check for: °? More redness, swelling, or pain. °? More fluid or blood. °? Warmth. °? Pus or a bad smell. °Activity °· Do not drive or use heavy machinery while taking prescription pain medicine. Do not drive until your health care provider approves. °· Until your health care provider approves: °? Do not lift anything that is heavier than 10 lb (4.5 kg). °? Do not play contact sports. °· Return to your normal activities as told by your health care provider. Ask your health care provider what activities are safe. °General instructions °· To prevent or treat constipation while you are taking prescription pain medicine, your health care provider may recommend that you: °? Drink enough fluid to keep your urine clear or pale yellow. °? Take over-the-counter or  prescription medicines. °? Eat foods that are high in fiber, such as fresh fruits and vegetables, whole grains, and beans. °? Limit foods that are high in fat and processed sugars, such as fried and sweet foods. °· Take over-the-counter and prescription medicines only as told by your health care provider. °· Do not take tub baths or go swimming until your health care provider approves. °· Keep all follow-up visits as told by your health care provider. This is important. °Contact a health care provider if: °· You develop a rash. °· You have more redness, swelling, or pain around your incision. °· You have more fluid or blood coming from your incision. °· Your incision feels warm to the touch. °· You have pus or a bad smell coming from your incision. °· You have a fever or chills. °· You have blood in your stool (feces). °· You have not had a bowel movement in 2-3 days. °· Your pain is not controlled with medicine. °Get help right away if: °· You have chest pain or shortness of breath. °· You feel light-headed or feel faint. °· You have severe pain. °· You vomit and your pain is worse. °This information is not intended to replace advice given to you by your health care provider. Make sure you discuss any questions you have with your health care provider. °Document Released: 12/07/2004 Document Revised: 12/08/2015 Document Reviewed: 11/01/2015 °Elsevier Interactive Patient Education © 2018 [employer] ° °General Anesthesia, Adult, Care After °These instructions provide you with information about caring for yourself after   your procedure. Your health care provider may also give you more specific instructions. Your treatment has been planned according to current medical practices, but problems sometimes occur. Call your health care provider if you have any problems or questions after your procedure. °What can I expect after the procedure? °After the procedure, it is common to have: °· Vomiting. °· A sore  throat. °· Mental slowness. ° °It is common to feel: °· Nauseous. °· Cold or shivery. °· Sleepy. °· Tired. °· Sore or achy, even in parts of your body where you did not have surgery. ° °Follow these instructions at home: °For at least 24 hours after the procedure: °· Do not: °? Participate in activities where you could fall or become injured. °? Drive. °? Use heavy machinery. °? Drink alcohol. °? Take sleeping pills or medicines that cause drowsiness. °? Make important decisions or sign legal documents. °? Take care of children on your own. °· Rest. °Eating and drinking °· If you vomit, drink water, juice, or soup when you can drink without vomiting. °· Drink enough fluid to keep your urine clear or pale yellow. °· Make sure you have little or no nausea before eating solid foods. °· Follow the diet recommended by your health care provider. °General instructions °· Have a responsible adult stay with you until you are awake and alert. °· Return to your normal activities as told by your health care provider. Ask your health care provider what activities are safe for you. °· Take over-the-counter and prescription medicines only as told by your health care provider. °· If you smoke, do not smoke without supervision. °· Keep all follow-up visits as told by your health care provider. This is important. °Contact a health care provider if: °· You continue to have nausea or vomiting at home, and medicines are not helpful. °· You cannot drink fluids or start eating again. °· You cannot urinate after 8-12 hours. °· You develop a skin rash. °· You have fever. °· You have increasing redness at the site of your procedure. °Get help right away if: °· You have difficulty breathing. °· You have chest pain. °· You have unexpected bleeding. °· You feel that you are having a life-threatening or urgent problem. °This information is not intended to replace advice given to you by your health care provider. Make sure you discuss any  questions you have with your health care provider. °Document Released: 08/26/2000 Document Revised: 10/23/2015 Document Reviewed: 05/04/2015 °Elsevier Interactive Patient Education © 2018 [employer] ° ° °

## 2018-01-19 NOTE — Op Note (Signed)
Patient:  Phillip Carrillo  DOB:  1966-03-22  MRN:  [id number]   Preop Diagnosis: Right inguinal hernia  Postop Diagnosis: Same  Procedure: Right inguinal herniorrhaphy with mesh  Surgeon: Franky MachoMark Omauri Boeve, MD  Assistant: Larae GroomsLindsey Bridges, MD  Anes: General  Indications: Patient is a 52 year old black male who presents with a symptomatic right inguinal hernia.  The risks and benefits of the procedure including bleeding, infection, mesh use, and the possibility of recurrence of the hernia were fully explained to the patient, who gave informed consent.  Procedure note: The patient was placed in supine position.  After general anesthesia was administered, the right groin region was prepped and draped using the usual sterile technique with DuraPrep.  Surgical site confirmation was performed.  An incision was made in the right groin region down to the external Bleich aponeurosis.  The aponeurosis was incised to the external ring.  A Penrose drain was placed around the spermatic cord.  The vase deferens was noted within the spermatic cord.  The ileal inguinal nerve was identified.  An indirect hernia was found.  This was freed away from the spermatic cord up to the peritoneal reflection and inverted.  A medium sized Bard PerFix plug was then inserted.  An onlay patch was placed along the floor the inguinal canal and secured superiorly to the conjoined tendon and inferiorly to the shelving edge of Poupart's ligament using 2-0 Novafil and Prolene interrupted sutures.  The internal ring was re-created using a 2-0 Novafil interrupted suture.  The external Bleich aponeurosis was reapproximated using a 2-0 Vicryl running suture.  Subcutaneous layer was reapproximated using a 3-0 Vicryl interrupted suture.  Exparel was instilled into the surrounding wound.  The skin was closed using a 4-0 Monocryl subcuticular suture.  Dermabond was applied.  All tape and needle counts were correct at the end of the procedure.   Patient was awakened and transferred to PACU in stable condition.  Dr. Henreitta LeberBridges assisted throughout the procedure as no first assistant was available.   Complications: None  EBL: Minimal  Specimen: None

## 2018-01-19 NOTE — Anesthesia Postprocedure Evaluation (Signed)
Anesthesia Post Note  Patient: Phillip Carrillo  Procedure(s) Performed: RIGHT INGUINAL HERNIORRHAPHY WITH MESH (Right )  Patient location during evaluation: PACU Anesthesia Type: General Level of consciousness: awake and patient cooperative Pain management: pain level controlled Vital Signs Assessment: post-procedure vital signs reviewed and stable Respiratory status: spontaneous breathing, nonlabored ventilation and respiratory function stable Cardiovascular status: blood pressure returned to baseline Postop Assessment: no apparent nausea or vomiting Anesthetic complications: no     Last Vitals:  Vitals:   01/19/18 0914 01/19/18 0915  BP: (!) 153/103 (!) 153/103  Pulse: 62 87  Resp: 11 18  Temp:    SpO2: 100% 100%    Last Pain:  Vitals:   01/19/18 0914  TempSrc:   PainSc: 4                  Annaliah Rivenbark J

## 2018-01-19 NOTE — Anesthesia Preprocedure Evaluation (Signed)
Anesthesia Evaluation  Patient identified by MRN, date of birth, ID band Patient awake    Reviewed: Allergy & Precautions, H&P , NPO status , Patient's Chart, lab work & pertinent test results, reviewed documented beta blocker date and time   Airway Mallampati: II  TM Distance: >3 FB Neck ROM: full    Dental no notable dental hx. (+) Teeth Intact, Dental Advidsory Given   Pulmonary neg pulmonary ROS,    Pulmonary exam normal breath sounds clear to auscultation       Cardiovascular Exercise Tolerance: Good negative cardio ROS   Rhythm:regular Rate:Normal     Neuro/Psych negative neurological ROS  negative psych ROS   GI/Hepatic negative GI ROS, Neg liver ROS,   Endo/Other  negative endocrine ROS  Renal/GU negative Renal ROS  negative genitourinary   Musculoskeletal   Abdominal   Peds  Hematology negative hematology ROS (+)   Anesthesia Other Findings Denies any sig PHM, specifically no CV, CNS, Neuro. EKG suggestive previous ischemia- denies any heart related issues Limited exercise tolerance, sedentary  Reproductive/Obstetrics negative OB ROS                             Anesthesia Physical Anesthesia Plan  ASA: II  Anesthesia Plan: General   Post-op Pain Management:    Induction:   PONV Risk Score and Plan:   Airway Management Planned:   Additional Equipment:   Intra-op Plan:   Post-operative Plan:   Informed Consent: I have reviewed the patients History and Physical, chart, labs and discussed the procedure including the risks, benefits and alternatives for the proposed anesthesia with the patient or authorized representative who has indicated his/her understanding and acceptance.   Dental Advisory Given  Plan Discussed with: CRNA and Anesthesiologist  Anesthesia Plan Comments:         Anesthesia Quick Evaluation

## 2018-01-20 ENCOUNTER — Encounter (HOSPITAL_COMMUNITY): Payer: Self-pay | Admitting: General Surgery

## 2018-01-27 ENCOUNTER — Encounter: Payer: Self-pay | Admitting: General Surgery

## 2018-01-27 ENCOUNTER — Ambulatory Visit (INDEPENDENT_AMBULATORY_CARE_PROVIDER_SITE_OTHER): Payer: Self-pay | Admitting: General Surgery

## 2018-01-27 VITALS — BP 170/108 | HR 76 | Temp 97.3°F | Resp 20 | Wt 205.0 lb

## 2018-01-27 DIAGNOSIS — Z09 Encounter for follow-up examination after completed treatment for conditions other than malignant neoplasm: Secondary | ICD-10-CM

## 2018-01-27 NOTE — Progress Notes (Signed)
Subjective:     Phillip Carrillo  Status post right inguinal herniorrhaphy with mesh.  Doing well.  Has no pain. Objective:    BP (!) 170/108 (BP Location: Left Arm, Patient Position: Sitting, Cuff Size: Normal)   Pulse 76   Temp (!) 97.3 F (36.3 C) (Temporal)   Resp 20   Wt 205 lb (93 kg)   BMI 30.72 kg/m   General:  alert, cooperative and no distress  Abdomen soft, incision healing well.     Assessment:    Doing well postoperatively.    Plan:   Increase activity as able.  May return to work without restrictions on 02/03/2018.  Follow-up here as needed.

## 2018-04-21 ENCOUNTER — Ambulatory Visit
Admission: RE | Admit: 2018-04-21 | Discharge: 2018-04-21 | Disposition: A | Discharge status: Home / Self Care | Payer: 59 | Source: Ambulatory Visit | Attending: Physical Medicine and Rehabilitation | Admitting: Physical Medicine and Rehabilitation

## 2018-04-21 ENCOUNTER — Other Ambulatory Visit: Payer: Self-pay | Admitting: Physical Medicine and Rehabilitation

## 2018-04-21 DIAGNOSIS — Z579 Occupational exposure to unspecified risk factor: Secondary | ICD-10-CM

## 2018-06-20 DIAGNOSIS — R1013 Epigastric pain: Secondary | ICD-10-CM | POA: Diagnosis not present

## 2018-06-20 DIAGNOSIS — Z1211 Encounter for screening for malignant neoplasm of colon: Secondary | ICD-10-CM | POA: Diagnosis not present

## 2018-06-20 DIAGNOSIS — I10 Essential (primary) hypertension: Secondary | ICD-10-CM | POA: Diagnosis not present

## 2018-08-07 DIAGNOSIS — H52221 Regular astigmatism, right eye: Secondary | ICD-10-CM | POA: Diagnosis not present

## 2018-08-19 ENCOUNTER — Encounter: Payer: Self-pay | Admitting: Gastroenterology

## 2019-09-23 IMAGING — CT CT ABD-PELV W/ CM
2 of 5 series · 15 of 46 positions shown, 17 images · IV contrast (APPLIED)
Comparison: Prior CT from 01/29/2012.

ADDENDUM:
A correction is made to the impression of the report.

1. Fat containing RIGHT inguinal hernia which could result in RIGHT
lower quadrant pain/discomfort. (Incorrectly reported as LEFT
inguinal hernia in the impression of the initial report).
Remainder of report as previously dictated.
CLINICAL DATA: Initial evaluation for intermittent right lower
quadrant pain for 1 month.
EXAM:
CT ABDOMEN AND PELVIS WITH CONTRAST
TECHNIQUE: Multidetector CT imaging of the abdomen and pelvis was performed
using the standard protocol following bolus administration of
intravenous contrast.
CONTRAST:  100mL OMNIPAQUE IOHEXOL 300 MG/ML  SOLN

[Series 3: abdomen 5.0 · axial · 0.78mm/px · z∈[+808,+1218]mm · 12 of 96 slices shown, 14 images]
[im 7/96  soft-tissue]
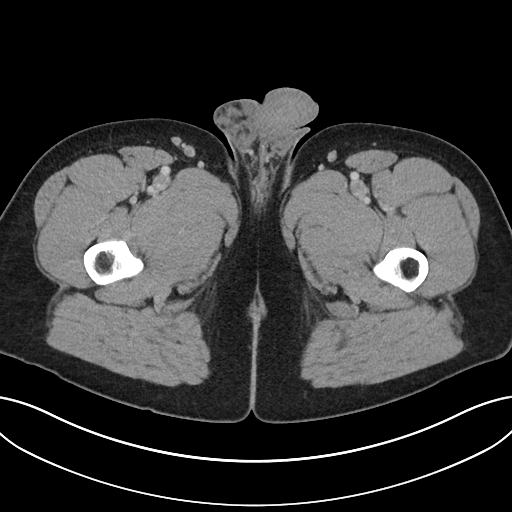
[im 7/96  bone]
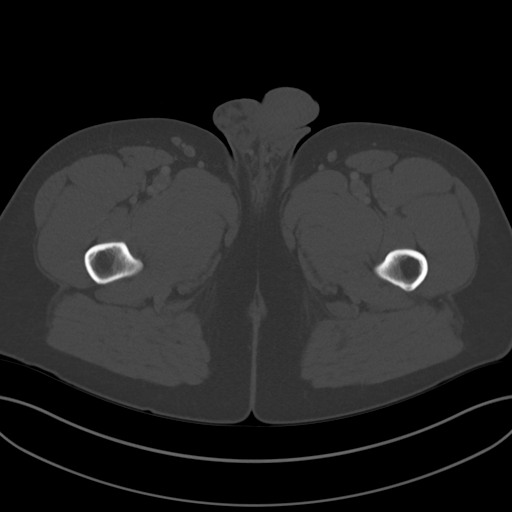
[im 14/96  soft-tissue]
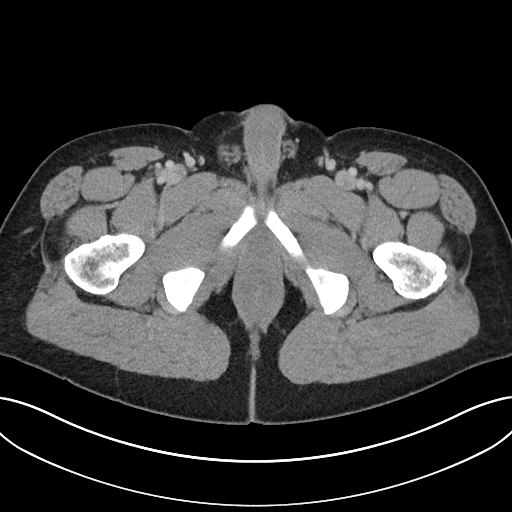
[im 21/96  soft-tissue]
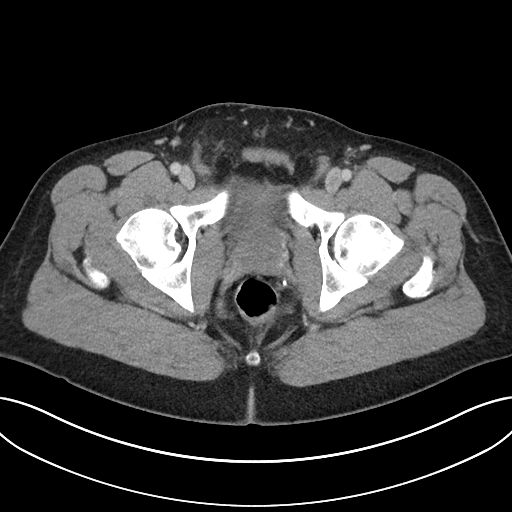
[im 28/96  soft-tissue]
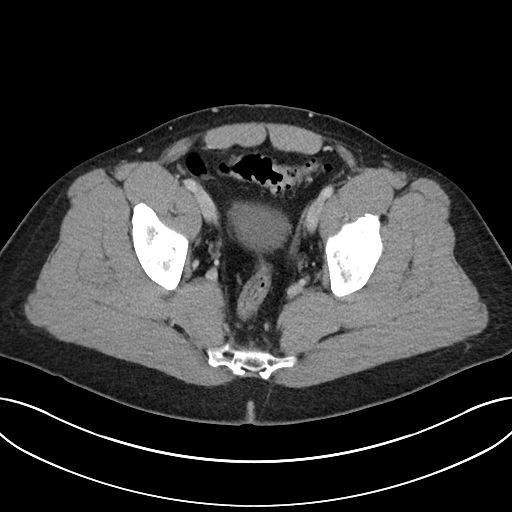
[im 34/96  soft-tissue]
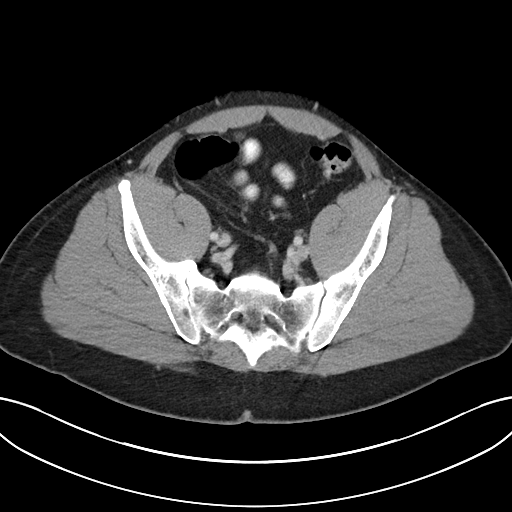
[im 41/96  soft-tissue]
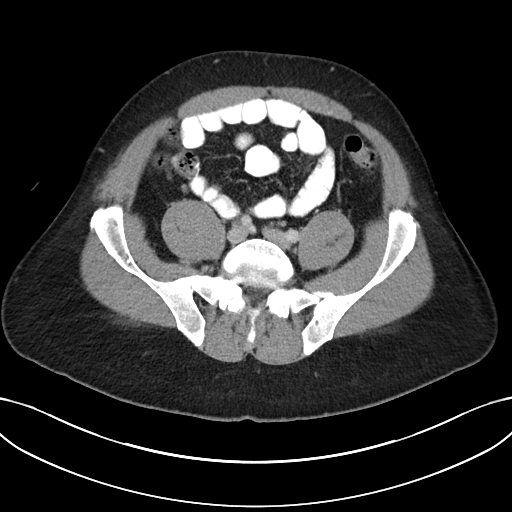
[im 55/96  soft-tissue]
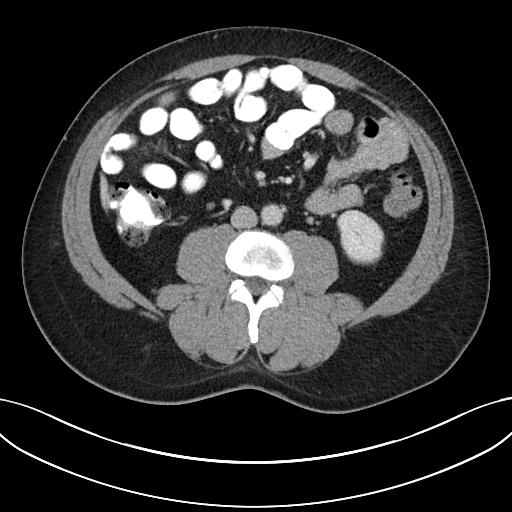
[im 62/96  soft-tissue]
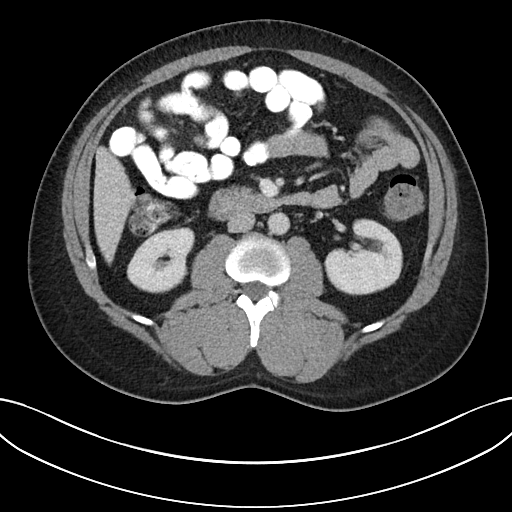
[im 68/96  soft-tissue]
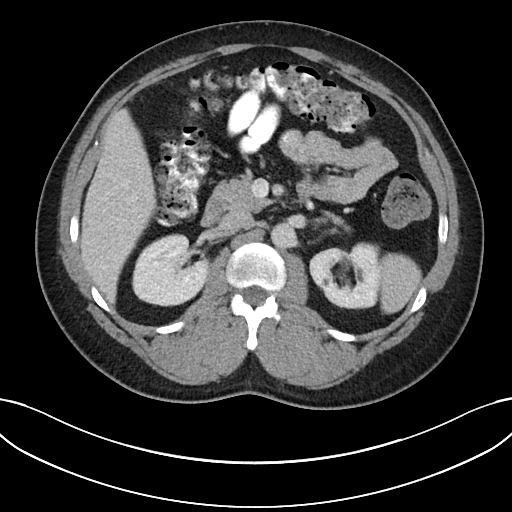
[im 68/96  bone]
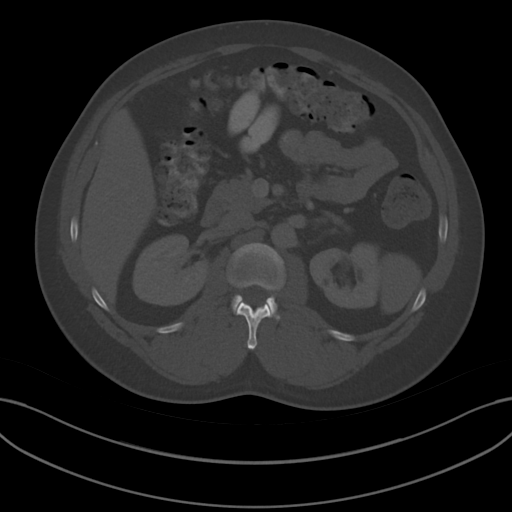
[im 75/96  soft-tissue]
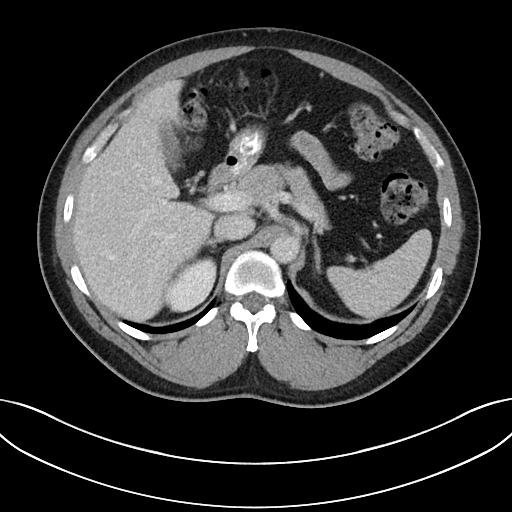
[im 82/96  soft-tissue]
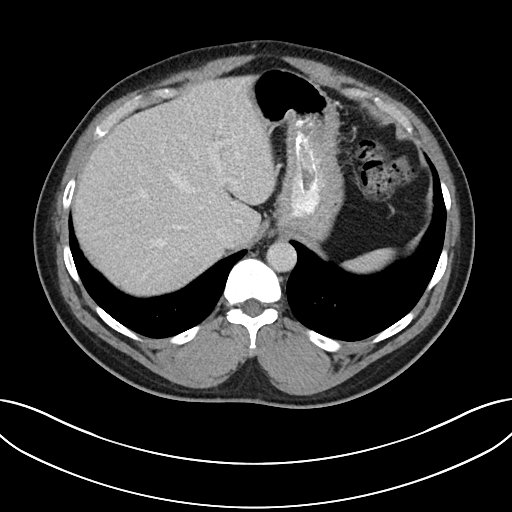
[im 89/96  soft-tissue]
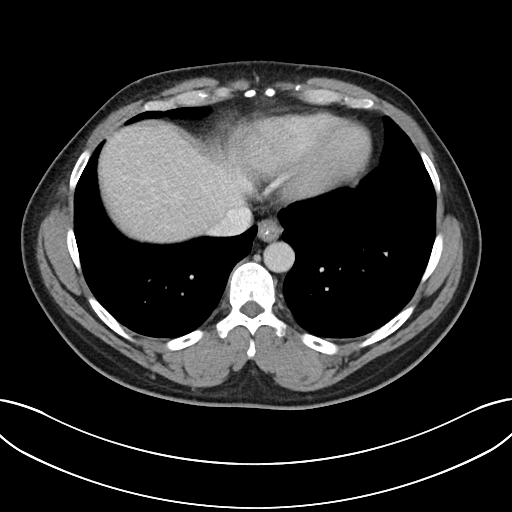

[Series 6: abdomen 3.0 mpr cor · coronal · 0.85mm/px · 3 of 109 slices shown]
[im 37/109  soft-tissue]
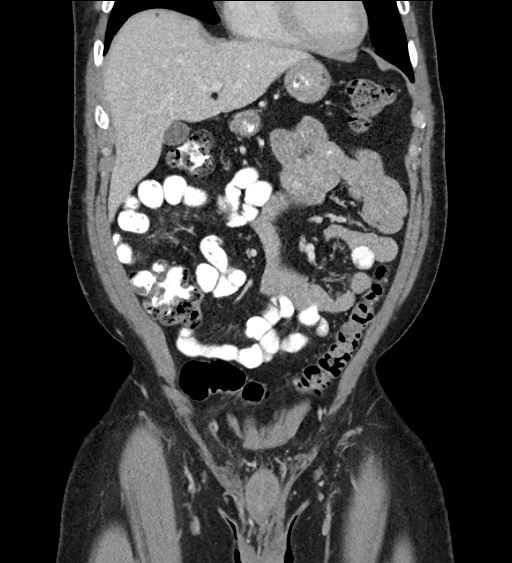
[im 49/109  soft-tissue]
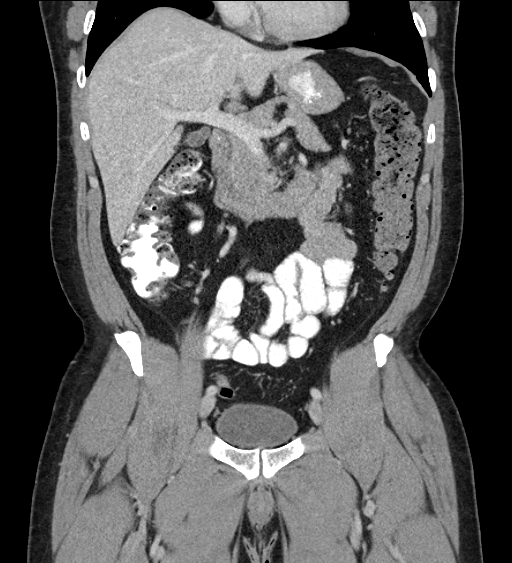
[im 61/109  soft-tissue]
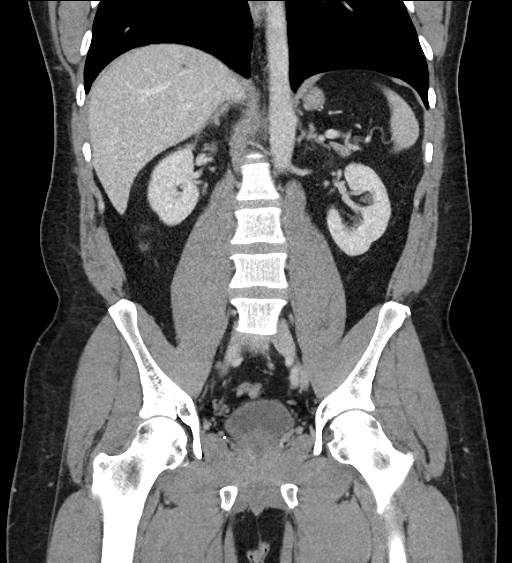

[15 of 46 positions shown; findings below may reference images not displayed]

FINDINGS: Lower chest: Mild hazy ground-glass opacity seen scattered within
the lung bases, nonspecific, but suspected to reflect mild
atelectatic changes. Few 4 mm subpleural nodules at the left lung
base (series 5, images 23, 37). Visualized lungs are otherwise
clear.

Hepatobiliary: Multiple hypodense subcentimeter lesions seen within
the liver, largest of which measures approximately 1 cm,
predominantly too small the characterize, but suspected to reflect
small cyst. Liver otherwise unremarkable. Gallbladder within normal
limits. No biliary dilatation.

Pancreas: Pancreas within normal limits.

Spleen: Spleen within normal limits.

Adrenals/Urinary Tract: Adrenal glands are normal. Kidneys equal in
size with symmetric enhancement. Subcentimeter hypodensity within
the left kidney too small the characterize, but statistically likely
reflects small cyst. No nephrolithiasis, hydronephrosis, or focal
enhancing renal mass. No hydroureter. Partially distended bladder
within normal limits.

Stomach/Bowel: Stomach within normal limits. No evidence for bowel
obstruction. Appendix appears to be absent. Mild colonic
diverticulosis without evidence for acute diverticulitis. No acute
inflammatory changes seen about the bowels.

Vascular/Lymphatic: Normal intravascular enhancement seen throughout
the intra-abdominal aorta and its branch vessels. No adenopathy.

Reproductive: Prostate borderline enlarged at 4.8 cm.

Other: No free air or fluid. Small fat containing paraumbilical
hernia without associated inflammation. There is a small moderate
sized fat containing right inguinal hernia, which could result in
right lower quadrant pain (series 3, image 77).

Musculoskeletal: No acute osseous abnormality. No worrisome lytic or
blastic osseous lesions.
IMPRESSION: 1. Fat containing left inguinal hernia, which could result in right
lower quadrant pain/discomfort.
2. No other acute intra-abdominal or pelvic process.
3. Mild colonic diverticulosis without evidence for acute
diverticulitis.
4. 4 mm subpleural left lower lobe nodules as above, indeterminate.
No follow-up needed if patient is low-risk (and has no known or
suspected primary neoplasm). Non-contrast chest CT can be considered
in 12 months if patient is high-risk. This recommendation follows
the consensus statement: Guidelines for Management of Incidental
Pulmonary Nodules Detected on CT Images: From the [HOSPITAL]

## 2020-03-01 IMAGING — DX DG CHEST 1V
1 series · 1 of 1 positions shown · non-contrast
Comparison: CT scan of the chest September 05, 2014

CLINICAL DATA: Occupational exposure in the workplace. No current
complaints.

EXAM:
CHEST  1 VIEW

[dg chest 1 view]
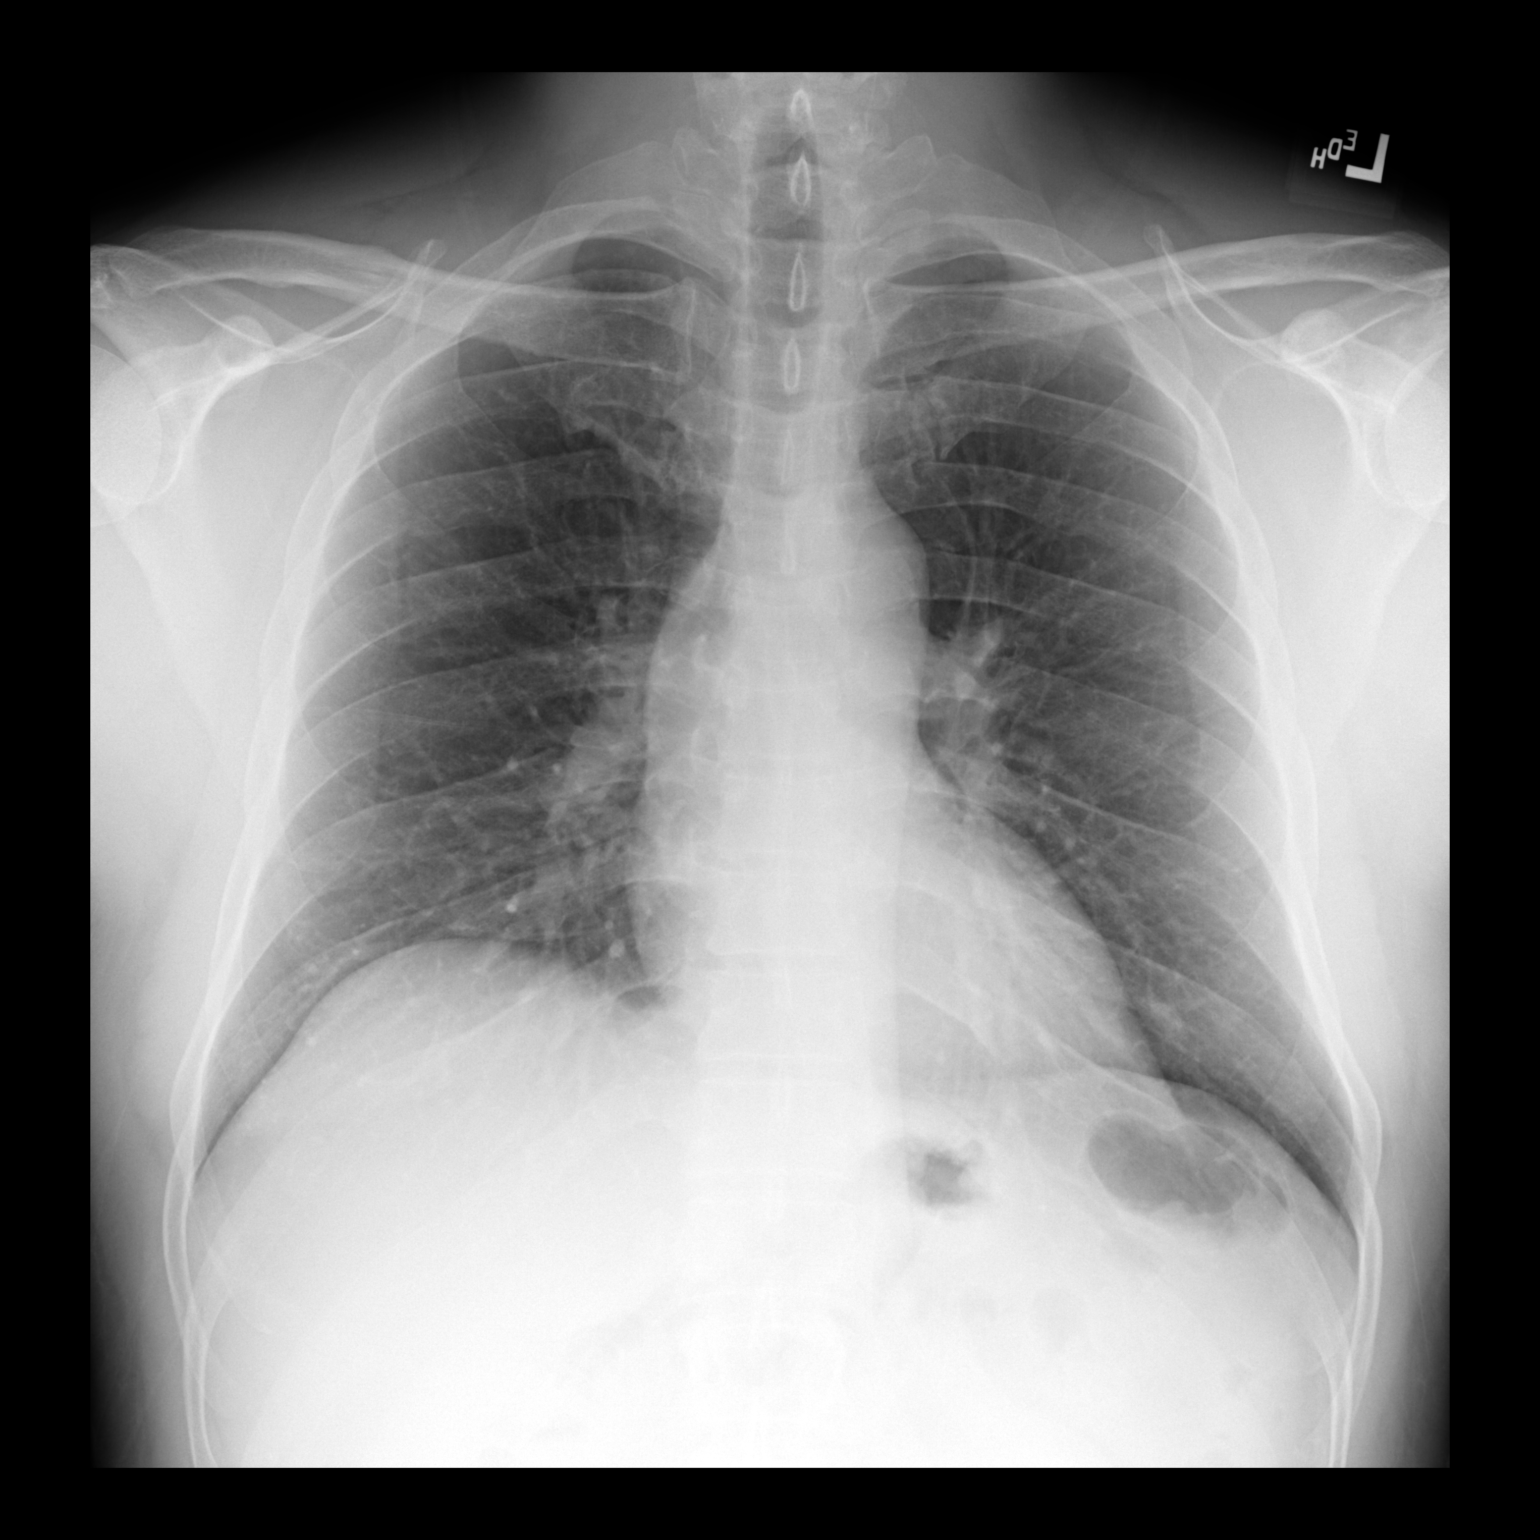

[1 of 1 positions shown; findings below may reference images not displayed]

FINDINGS: The lungs are adequately inflated and clear. The heart and pulmonary
vascularity are normal. The mediastinum is normal in width. There is
no pleural effusion. The bony thorax exhibits no acute abnormality.
IMPRESSION: There is no active cardiopulmonary disease.

## 2024-04-23 ENCOUNTER — Other Ambulatory Visit: Payer: Self-pay | Admitting: *Deleted

## 2024-04-23 ENCOUNTER — Ambulatory Visit: Attending: Physician Assistant

## 2024-04-23 ENCOUNTER — Encounter: Payer: Self-pay | Admitting: *Deleted

## 2024-04-23 DIAGNOSIS — R002 Palpitations: Secondary | ICD-10-CM

## 2024-04-23 NOTE — Progress Notes (Unsigned)
 Enrolled for [employer] to mail a ZIO XT long term holter monitor to the patients address on file.   Letter with instructions mailed to patient.  Dr. Worth Turmel to read.

## 2024-05-05 ENCOUNTER — Ambulatory Visit: Attending: Physician Assistant

## 2024-05-05 ENCOUNTER — Other Ambulatory Visit: Payer: Self-pay | Admitting: *Deleted

## 2024-05-05 DIAGNOSIS — R002 Palpitations: Secondary | ICD-10-CM

## 2024-05-05 NOTE — Progress Notes (Unsigned)
 Enrolled for [employer] to mail a ZIO XT long term holter monitor to the patients address on file.   EP to read
